# Patient Record
Sex: Female | Born: 1980 | Race: White | Hispanic: No | Marital: Married | Smoking: Never smoker
Health system: Southern US, Community
[De-identification: ages and names within clinical notes are randomized; demographics above are authoritative.]

## PROBLEM LIST (undated history)

## (undated) HISTORY — PX: CHOLECYSTECTOMY: SHX55

---
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
## 2001-04-23 ENCOUNTER — Other Ambulatory Visit: Admission: RE | Admit: 2001-04-23 | Discharge: 2001-04-23 | Payer: Self-pay | Admitting: *Deleted

## 2002-10-10 ENCOUNTER — Other Ambulatory Visit: Admission: RE | Admit: 2002-10-10 | Discharge: 2002-10-10 | Payer: Self-pay | Admitting: *Deleted

## 2010-08-15 ENCOUNTER — Encounter: Payer: Self-pay | Admitting: Family Medicine

## 2014-01-20 ENCOUNTER — Other Ambulatory Visit: Payer: Self-pay | Admitting: *Deleted

## 2014-01-20 ENCOUNTER — Telehealth: Payer: Self-pay | Admitting: *Deleted

## 2014-01-20 NOTE — Telephone Encounter (Signed)
Pt dropped off urine at [facility]. They need order for urinalysis and urine culture. Ok per Dr. Brett CanalesSteve. Order faxed.

## 2014-04-18 ENCOUNTER — Ambulatory Visit (INDEPENDENT_AMBULATORY_CARE_PROVIDER_SITE_OTHER): Payer: BC Managed Care – PPO | Admitting: Family Medicine

## 2014-04-18 ENCOUNTER — Encounter: Payer: Self-pay | Admitting: Family Medicine

## 2014-04-18 VITALS — BP 120/68 | Temp 98.9°F | Ht 67.0 in | Wt 170.0 lb

## 2014-04-18 DIAGNOSIS — M25559 Pain in unspecified hip: Secondary | ICD-10-CM

## 2014-04-18 DIAGNOSIS — M25569 Pain in unspecified knee: Principal | ICD-10-CM

## 2014-04-18 MED ORDER — DOXYCYCLINE HYCLATE 100 MG PO TABS: 100.0000 mg | ORAL_TABLET | Freq: Two times a day (BID) | ORAL | Status: DC

## 2014-04-18 NOTE — Progress Notes (Signed)
   Subjective:    Patient ID: Beverly Potts, female    DOB: April 26, 1981, 33 y.o.   MRN: [id number]  HPIHaving muscle and joint pain. Started several months ago. Getting worse. Has had lots of tick bites this summer.   achiness in the joints  Hand s feet and other joints   achey at times  Diminished energy  No sig   One expanded rash  achey no meds occas ibuprofen  No fever during these flares     Review of Systems No high fevers no headache no chills no change in bowel habits no chest pain ROS otherwise negative    Objective:   Physical Exam Alert no apparent distress. Vitals stable. H&T normal. Lungs clear. Heart rare rhythm. Legs no effusion in the joints no warmth no redness no acute tenderness.       Assessment & Plan:  Impression persistent arthralgias with history of many tick bites earlier in the summer. Discussed with patient it might be tick related to quite possibly not. Some family history of arthritis. Some family history of fibromyalgia. Plan trial of doxycycline for second stage Lyme disease. If no significant improvement Mora Appl return for further tests. WSL

## 2014-10-09 ENCOUNTER — Telehealth: Payer: Self-pay | Admitting: *Deleted

## 2014-10-09 NOTE — Telephone Encounter (Signed)
[facility] See [facility] report in nurse's basket.

## 2014-10-10 NOTE — Telephone Encounter (Signed)
[facility] to DR. Brett CanalesSteve. He wanted to know if they treated pt. Pt has been referred to a specialist. Pt states she does not need anything from our office at this time.

## 2014-12-23 ENCOUNTER — Other Ambulatory Visit: Payer: Self-pay

## 2014-12-23 MED ORDER — HALOBETASOL PROPIONATE 0.05 % EX OINT: TOPICAL_OINTMENT | Freq: Two times a day (BID) | CUTANEOUS | Status: DC

## 2015-10-02 ENCOUNTER — Emergency Department (HOSPITAL_COMMUNITY)
Admission: EM | Admit: 2015-10-02 | Discharge: 2015-10-02 | Disposition: A | Discharge status: Home / Self Care | Payer: Self-pay

## 2016-04-07 ENCOUNTER — Telehealth: Payer: Self-pay | Admitting: Family Medicine

## 2016-04-07 NOTE — Telephone Encounter (Signed)
#  1 certainly we are sympathetic #2 if her surgery was recent her surgeon truly all be the one to order this #3 if her surgery was a while ago or if the surgeon has essentially washed his hands that then patient will need an office visit tomorrow morning for further evaluation and set up for any tests including blood work and scans #4 it if patient feels she is having an emergent condition with severe pain or other worrisome signs she should go to the ER

## 2016-04-07 NOTE — Telephone Encounter (Signed)
Notified patient #1 certainly we are sympathetic #2 if her surgery was recent her surgeon truly all be the one to order this #3 if her surgery was a while ago or if the surgeon has essentially washed his hands that then patient will need an office visit tomorrow morning for further evaluation and set up for any tests including blood work and scans #4 it if patient feels she is having an emergent condition with severe pain or other worrisome signs she should go to the ER. Patient transferred to front desk to schedule appointment.

## 2016-04-07 NOTE — Telephone Encounter (Signed)
Patient had her gallbladder removed and she had gall stones which had to be removed by a separate procedure.  She started hurting again last night and believes she may need a CT scan to know if she needs to be set up to have the stones removed again.  She has called her surgeon and he said to do a CT scan, but told her to call her PCP to have this ordered.  She is hoping for a call back today.

## 2016-04-08 ENCOUNTER — Ambulatory Visit (INDEPENDENT_AMBULATORY_CARE_PROVIDER_SITE_OTHER): Payer: BLUE CROSS/BLUE SHIELD | Admitting: Family Medicine

## 2016-04-08 ENCOUNTER — Encounter: Payer: Self-pay | Admitting: Family Medicine

## 2016-04-08 ENCOUNTER — Other Ambulatory Visit (HOSPITAL_COMMUNITY)
Admission: RE | Admit: 2016-04-08 | Discharge: 2016-04-08 | Disposition: A | Discharge status: Home / Self Care | Payer: BLUE CROSS/BLUE SHIELD | Source: Ambulatory Visit | Attending: Family Medicine | Admitting: Family Medicine

## 2016-04-08 VITALS — BP 126/74 | Ht 67.0 in | Wt 174.5 lb

## 2016-04-08 DIAGNOSIS — R109 Unspecified abdominal pain: Secondary | ICD-10-CM | POA: Diagnosis not present

## 2016-04-08 LAB — HEPATIC FUNCTION PANEL
ALBUMIN: 3.9 g/dL (ref 3.5–5.0)
ALK PHOS: 76 U/L (ref 38–126)
ALT: 25 U/L (ref 14–54)
AST: 24 U/L (ref 15–41)
BILIRUBIN TOTAL: 0.3 mg/dL (ref 0.3–1.2)
Bilirubin, Direct: <0.1 mg/dL — ABNORMAL LOW (ref 0.1–0.5)
Total Protein: 8 g/dL (ref 6.5–8.1)

## 2016-04-08 LAB — CBC WITH DIFFERENTIAL/PLATELET
Basophils Absolute: 0 10*3/uL (ref 0.0–0.1)
Basophils Relative: 0 %
EOS PCT: 0 %
Eosinophils Absolute: 0 10*3/uL (ref 0.0–0.7)
HCT: 41.7 % (ref 36.0–46.0)
HEMOGLOBIN: 13.6 g/dL (ref 12.0–15.0)
LYMPHS ABS: 2.7 10*3/uL (ref 0.7–4.0)
LYMPHS PCT: 28 %
MCH: 28.2 pg (ref 26.0–34.0)
MCHC: 32.6 g/dL (ref 30.0–36.0)
MCV: 86.3 fL (ref 78.0–100.0)
Monocytes Absolute: 0.4 10*3/uL (ref 0.1–1.0)
Monocytes Relative: 4 %
Neutro Abs: 6.6 10*3/uL (ref 1.7–7.7)
Neutrophils Relative %: 68 %
PLATELETS: 314 10*3/uL (ref 150–400)
RBC: 4.83 MIL/uL (ref 3.87–5.11)
RDW: 12.4 % (ref 11.5–15.5)
WBC: 9.8 10*3/uL (ref 4.0–10.5)

## 2016-04-08 LAB — BASIC METABOLIC PANEL
Anion gap: 9 (ref 5–15)
BUN: 11 mg/dL (ref 6–20)
CHLORIDE: 102 mmol/L (ref 101–111)
CO2: 26 mmol/L (ref 22–32)
Calcium: 9.5 mg/dL (ref 8.9–10.3)
Creatinine, Ser: 0.67 mg/dL (ref 0.44–1.00)
GFR calc Af Amer: >60 mL/min (ref >60)
GFR calc non Af Amer: >60 mL/min (ref >60)
GLUCOSE: 113 mg/dL — AB (ref 65–99)
POTASSIUM: 3.6 mmol/L (ref 3.5–5.1)
Sodium: 137 mmol/L (ref 135–145)

## 2016-04-08 LAB — LIPASE, BLOOD: Lipase: 27 U/L (ref 11–51)

## 2016-04-08 LAB — AMYLASE: Amylase: 72 U/L (ref 28–100)

## 2016-04-08 MED ORDER — PREDNISONE 20 MG PO TABS: ORAL_TABLET | ORAL | 0 refills | Status: DC

## 2016-04-08 NOTE — Progress Notes (Signed)
   Subjective:    Patient ID: Beverly Potts, female    DOB: [birth year], 35 y.o.   MRN: [id number]  Abdominal Pain  This is a chronic problem. The current episode started in the past 7 days. The pain is located in the RUQ.   [facility]. Ha d baby three yrs ago, since then ongoing bgb issues  Had stones in the ducts  Got a ercp  Then next day cholecytectomy  thhis wk having abd pains and sever  Severe epigast/ruq pain, no nausea no vom  Pt touched base with g b they rec scan etc.  Aphthous ulcersIn the throat. Used to get him but now much more often since her ERCP intervention. Next  Patient is afraid she has another stone due to her discomfort. Blocking her bile duct.    Review of Systems  Gastrointestinal: Positive for abdominal pain.  No fever no chills no diarrhea no dysuria     Objective:   Physical Exam  Alert talkative no acute distress vital stable lungs clear heart rare rhythm m moderate right upper epigastric discomfort to palpation      Assessment & Plan:  Impression recurrence of abdominal pain. Patient feels colicky aspect similar to prior pain before cholecystectomy. Did have ERCP. Long discussion with patient. And I spoke with radiologist. They recommended ultrasound and is in good screening tests look at the common bile duct. We did urgent blood work. Please see results. Another long discussion held with the patient after that. Patient noted pain is now progressing and questions duodenitis ulcer etc. I told her this is definitely on the [place] if her ultrasound returns negative. We will go ahead and add an H. pylori. Patient to use Zantac for now. She did not want to use proton pump inhibitor until after workup is complete. Easily 35-40 minutes spent most in discussion between patient before and after testing along with discussion of radiologist elucidation a history discussion of potential etiologies etc.

## 2016-04-11 ENCOUNTER — Other Ambulatory Visit: Payer: Self-pay

## 2016-04-11 ENCOUNTER — Ambulatory Visit (HOSPITAL_COMMUNITY)
Admission: RE | Admit: 2016-04-11 | Discharge: 2016-04-11 | Disposition: A | Discharge status: Home / Self Care | Payer: BLUE CROSS/BLUE SHIELD | Source: Ambulatory Visit | Attending: Family Medicine | Admitting: Family Medicine

## 2016-04-11 ENCOUNTER — Telehealth: Payer: Self-pay | Admitting: Family Medicine

## 2016-04-11 DIAGNOSIS — R109 Unspecified abdominal pain: Secondary | ICD-10-CM

## 2016-04-11 DIAGNOSIS — Z9049 Acquired absence of other specified parts of digestive tract: Secondary | ICD-10-CM | POA: Insufficient documentation

## 2016-04-11 MED ORDER — PANTOPRAZOLE SODIUM 40 MG PO TBEC: 40.0000 mg | DELAYED_RELEASE_TABLET | Freq: Every day | ORAL | 5 refills | Status: DC

## 2016-04-11 NOTE — Addendum Note (Signed)
Addended by: Jeralene PetersREWS, Cartier Mapel R on: 04/11/2016 03:49 PM   Modules accepted: Orders

## 2016-04-11 NOTE — Telephone Encounter (Signed)
Patient called and asked if we could call in a prescription probiotic that she can take this weekend to calm her stomach down while at the beach. Please advise?

## 2016-04-11 NOTE — Progress Notes (Unsigned)
Ordered H.Pylori blood work per Charles SchwabDr.Steve Luking- Patient notified and stated that she had H.Pylori drawn yesterday at [facility] [place] yesterday due to pain. Patient also stated that she had ultrasound done today.

## 2016-04-14 ENCOUNTER — Encounter: Payer: Self-pay | Admitting: Gastroenterology

## 2016-04-14 NOTE — Telephone Encounter (Signed)
Spoke with patient and informed her per Dr.Steve Luking-Dr.Steve has not prescribed probiotics before. Dr.Steve recommends align or you can call pharmacist and see what they recommend. Patient verbalized understanding.

## 2016-04-14 NOTE — Telephone Encounter (Signed)
Ive never rx'ed I think otc variations are fine I like align or ask her pharm which they recommedn

## 2016-05-18 ENCOUNTER — Encounter: Payer: Self-pay | Admitting: Gastroenterology

## 2016-05-18 ENCOUNTER — Ambulatory Visit (INDEPENDENT_AMBULATORY_CARE_PROVIDER_SITE_OTHER): Payer: BLUE CROSS/BLUE SHIELD | Admitting: Gastroenterology

## 2016-05-18 DIAGNOSIS — R194 Change in bowel habit: Secondary | ICD-10-CM | POA: Insufficient documentation

## 2016-05-18 MED ORDER — DICYCLOMINE HCL 10 MG PO CAPS
10.0000 mg | ORAL_CAPSULE | Freq: Three times a day (TID) | ORAL | 3 refills | Status: AC
Start: 2016-05-18 — End: ?

## 2016-05-18 NOTE — Progress Notes (Signed)
       Primary Care Physician:  Veto KempsKelli Banner, NP  Primary Gastroenterologist:  Dr. Darrick PennaFields   Chief Complaint  Patient presents with  . Abdominal Pain    pain better but still loose BM; stool cultures, c.diff, o&p neg. Xray and CT negative    HPI:   Beverly Potts is a 35 y.o. female presenting today at the request of Veto KempsKelli Banner, NP, secondary to diarrhea.   [facility] Had ERCP due to CBD stones, cholecystectomy. September 2017 had severe pain in epigastric region, profuse watery stool. Was having 20-30 loose stools per day. Treated empirically with Flagyl and got better. Now having 5-6 soft/loose stools per day. No abdominal pain now. No weight loss.   Baseline bowel habits 3-4 times per week. No rectal bleeding. Reportedly had negative stool studies, but I do not have these available.   No past medical history on file.  Past Surgical History:  Procedure Laterality Date  . CHOLECYSTECTOMY     with ERCP     Current Outpatient Prescriptions  Medication Sig Dispense Refill  . levonorgestrel-ethinyl estradiol (SEASONALE,INTROVALE,JOLESSA) 0.15-0.03 MG tablet Take 1 tablet by mouth daily.  4  . dicyclomine (BENTYL) 10 MG capsule Take 1 capsule (10 mg total) by mouth 4 (four) times daily -  before meals and at bedtime. 120 capsule 3   No current facility-administered medications for this visit.     Allergies as of 05/18/2016  . (No Known Allergies)    Family History  Problem Relation Age of Onset  . Colon cancer Maternal Aunt   . Colon polyps Neg Hx     Social History   Social History  . Marital status: Married    Spouse name: N/A  . Number of children: N/A  . Years of education: N/A   Occupational History  . Not on file.   Social History Main Topics  . Smoking status: Never Smoker  . Smokeless tobacco: Never Used  . Alcohol use No  . Drug use: No  . Sexual activity: Not on file   Other Topics Concern  . Not on file   Social History  Narrative  . No narrative on file    Review of Systems: As mentioned in HPI   Physical Exam: BP 125/86   Pulse 95   Temp 98.2 F (36.8 C) (Oral)   Ht 5\' 9"  (1.753 m)   Wt 170 lb 6.4 oz (77.3 kg)   LMP 08/10/2015 (Approximate)   BMI 25.16 kg/m  General:   Alert and oriented. Pleasant and cooperative. Well-nourished and well-developed.  Head:  Normocephalic and atraumatic. Eyes:  Without icterus, sclera clear and conjunctiva pink.  Ears:  Normal auditory acuity. Lungs:  Clear to auscultation bilaterally. No wheezes, rales, or rhonchi. No distress.  Heart:  S1, S2 present without murmurs appreciated.  Abdomen:  +BS, soft, non-tender and non-distended. No HSM noted. No guarding or rebound. No masses appreciated.  Rectal:  Deferred  Msk:  Symmetrical without gross deformities. Normal posture. Extremities:  Without clubbing or edema. Neurologic:  Alert and  oriented x4;  grossly normal neurologically. Psych:  Alert and cooperative. Normal mood and affect.

## 2016-05-18 NOTE — Patient Instructions (Signed)
Start taking Bentyl two to 4 times a day with meals and at bedtime (no more than 4 a day). Monitor for constipation.   Start taking a probiotic daily.   We will see you in 6-8 weeks. Call if any recurrent symptoms.

## 2016-05-20 ENCOUNTER — Encounter: Payer: Self-pay | Admitting: Gastroenterology

## 2016-05-20 NOTE — Assessment & Plan Note (Signed)
35 year old female with self-limiting episode of severe diarrhea that is now much improved, reportedly negative stool studies, and symptomatically improving. Now with 5-6 soft/loose stools per day but without any overt GI bleeding or concerning lower GI signs. Query dealing with post-infectious IBS. Will attempt to retrieve stool reports, start bentyl for supportive measures, add probiotic, and return in 6-8 weeks. No colonoscopy indicated unless failure to improve, rectal bleeding noted, or worsening diarrhea.

## 2016-05-23 NOTE — Progress Notes (Signed)
CC'D TO PCP °

## 2016-07-11 ENCOUNTER — Encounter: Payer: Self-pay | Admitting: Gastroenterology

## 2016-07-11 ENCOUNTER — Ambulatory Visit (INDEPENDENT_AMBULATORY_CARE_PROVIDER_SITE_OTHER): Payer: BLUE CROSS/BLUE SHIELD | Admitting: Gastroenterology

## 2016-07-11 VITALS — BP 121/81 | HR 106 | Temp 98.3°F | Ht 68.0 in | Wt 169.8 lb

## 2016-07-11 DIAGNOSIS — R194 Change in bowel habit: Secondary | ICD-10-CM

## 2016-07-11 DIAGNOSIS — R197 Diarrhea, unspecified: Secondary | ICD-10-CM | POA: Diagnosis not present

## 2016-07-11 NOTE — Patient Instructions (Signed)
Please have blood work done today.   Further recommendations to follow after review of blood work.

## 2016-07-11 NOTE — Progress Notes (Signed)
cc'ed to pcp °

## 2016-07-11 NOTE — Progress Notes (Signed)
    Referring Provider: Merlyn AlbertLuking, William S, MD Primary Care Physician:  Lubertha SouthSteve Luking, MD  Primary GI: Dr. Darrick PennaFields   Chief Complaint  Patient presents with  . Abdominal Pain    f/u, better, no problems as long as she doesn't eat fast food or drinks    HPI:   Beverly Potts is a 35 y.o. female presenting today with a history of self-limiting episode of severe diarrhea, stool studies including Cdiff negative. 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 Gallbladder absent, see prior HPI from 05/18/16.   Can eat burgers at home but not at fast food places. All fast food places tear her stomach up. Otherwise, no issues with BMs. Bentyl didn't help. No rectal bleeding. No issues with weight loss. Good appetite.    No past medical history on file.  Past Surgical History:  Procedure Laterality Date  . CHOLECYSTECTOMY     with ERCP     Current Outpatient Prescriptions  Medication Sig Dispense Refill  . levonorgestrel-ethinyl estradiol (SEASONALE,INTROVALE,JOLESSA) 0.15-0.03 MG tablet Take 1 tablet by mouth daily.  4  . dicyclomine (BENTYL) 10 MG capsule Take 1 capsule (10 mg total) by mouth 4 (four) times daily -  before meals and at bedtime. (Patient not taking: Reported on 07/11/2016) 120 capsule 3   No current facility-administered medications for this visit.     Allergies as of 07/11/2016  . (No Known Allergies)    Family History  Problem Relation Age of Onset  . Colon cancer Maternal Aunt   . Colon polyps Neg Hx     Social History   Social History  . Marital status: Married    Spouse name: N/A  . Number of children: N/A  . Years of education: N/A   Social History Main Topics  . Smoking status: Never Smoker  . Smokeless tobacco: Never Used  . Alcohol use No  . Drug use: No  . Sexual activity: Not Asked   Other Topics Concern  . None   Social History Narrative  . None    Review of Systems: As mentioned in HPI    Physical Exam: BP 121/81   Pulse (!) 106   Temp 98.3 F (36.8 C) (Oral)   Ht 5\' 8"  (1.727 m)   Wt 169 lb 12.8 oz (77 kg)   BMI 25.82 kg/m  General:   Alert and oriented. No distress noted. Pleasant and cooperative.  Head:  Normocephalic and atraumatic. Eyes:  Conjuctiva clear without scleral icterus. Mouth:  Oral mucosa pink and moist. Good dentition. No lesions. Abdomen:  +BS, soft, non-tender and non-distended. No rebound or guarding. No HSM or masses noted. Msk:  Symmetrical without gross deformities. Normal posture. Extremities:  Without edema. Neurologic:  Alert and  oriented x4 Psych:  Alert and cooperative. Normal mood and affect.

## 2016-07-11 NOTE — Assessment & Plan Note (Signed)
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 Symptoms much improved since last seen, only occurring with fast food intake and sodas. Bentyl without much improvement and actually bordered more on constipation. Will check celiac serologies to be thorough; no red flags noted. Further recommendations to follow.

## 2016-07-12 LAB — IGA: IGA: 300 mg/dL (ref 81–463)

## 2016-07-12 LAB — TISSUE TRANSGLUTAMINASE, IGA: TISSUE TRANSGLUTAMINASE AB, IGA: 1 U/mL (ref <4)

## 2016-07-16 NOTE — Progress Notes (Signed)
REVIEWED-NO ADDITIONAL RECOMMENDATIONS. 

## 2016-07-21 ENCOUNTER — Encounter: Payer: Self-pay | Admitting: Gastroenterology

## 2018-05-28 IMAGING — US US ABDOMEN LIMITED
1 series · 14 of 25 positions shown · non-contrast
Comparison: None.

CLINICAL DATA: Right upper quadrant pain .

EXAM:
US ABDOMEN LIMITED - RIGHT UPPER QUADRANT

[Series 1: us abdomen limited · 0.22mm/px · 14 of 46 slices shown]
[im 1/46]
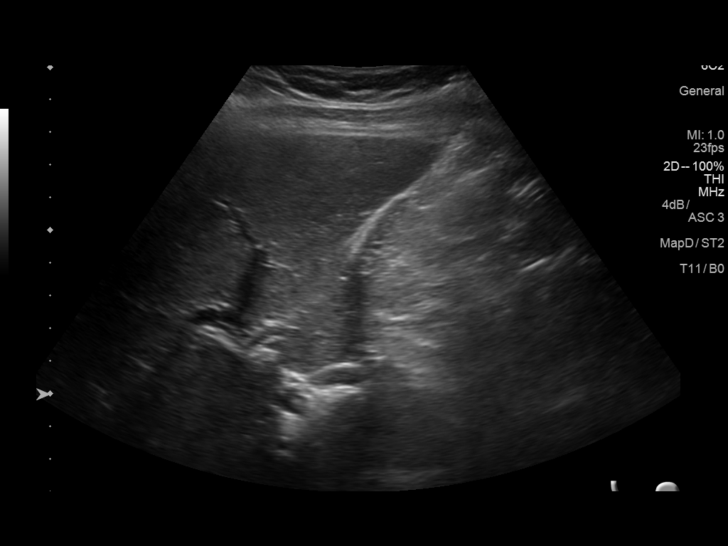
[im 4/46]
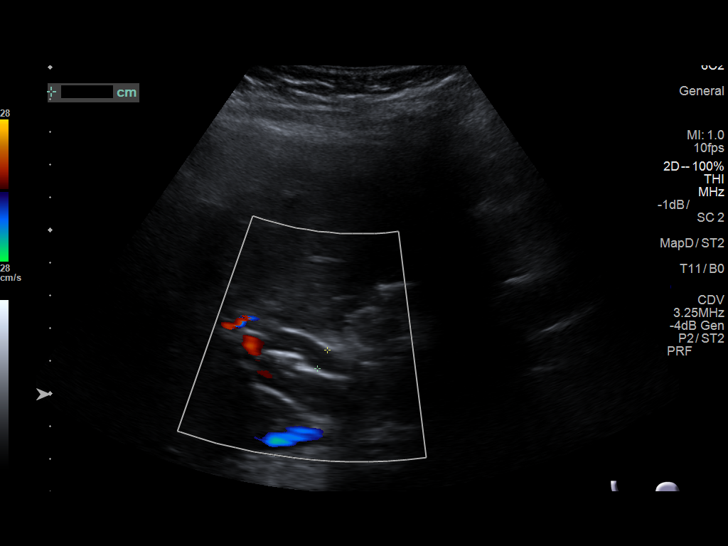
[im 8/46]
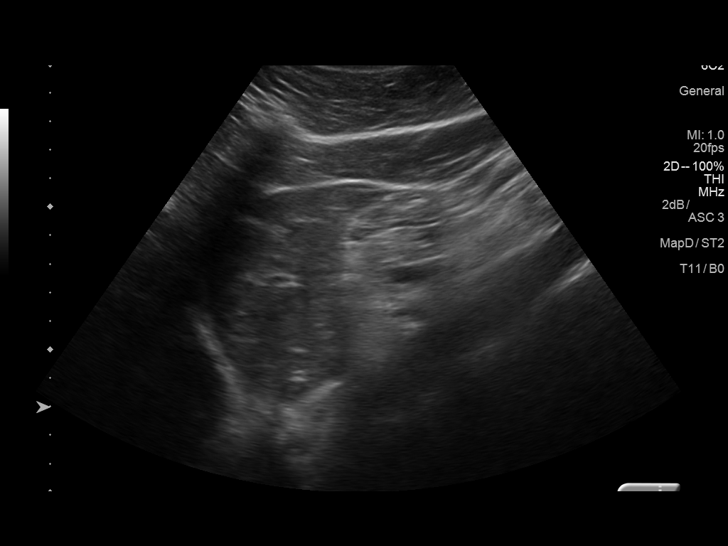
[im 12/46]
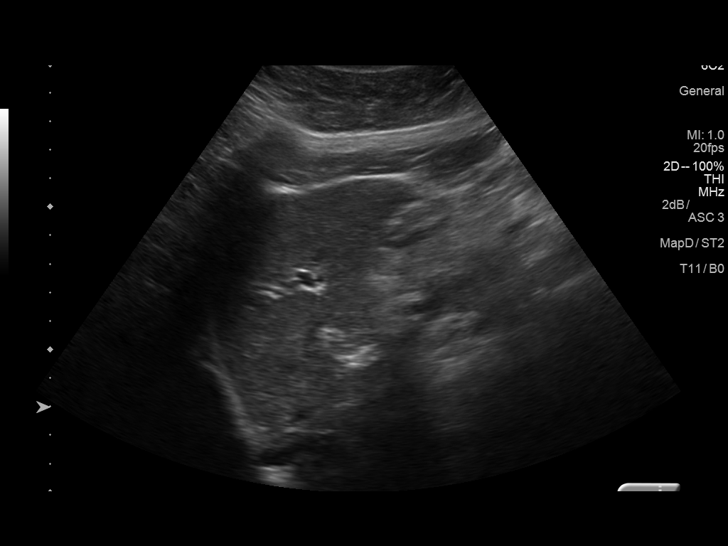
[im 16/46]
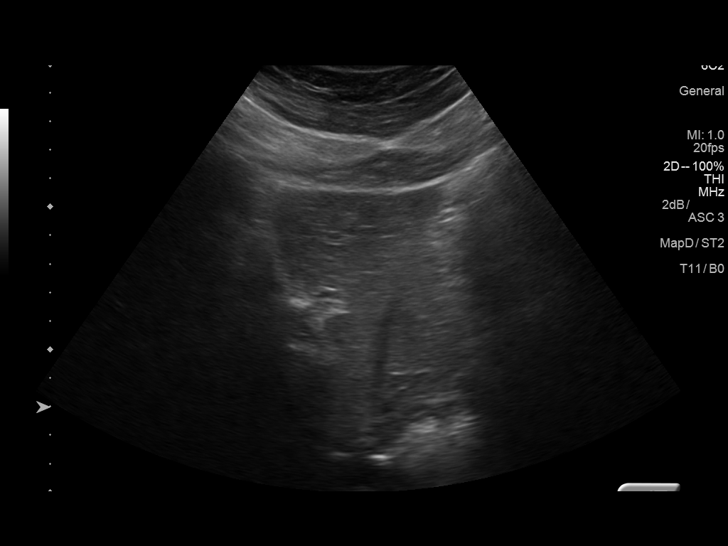
[im 17/46]
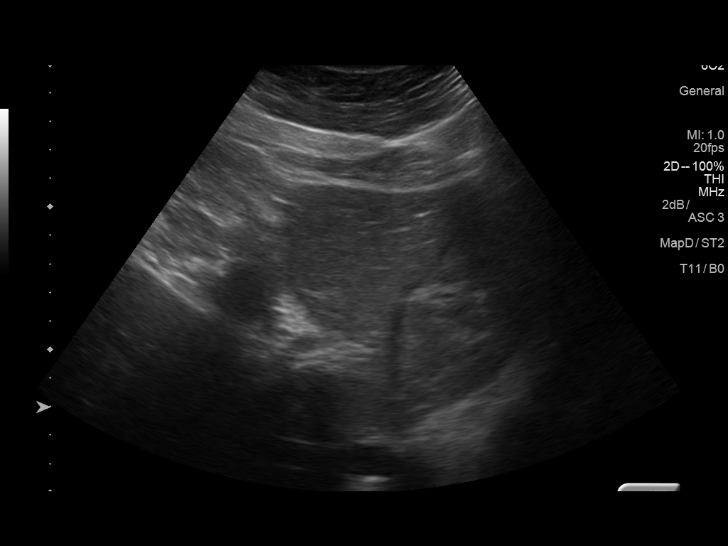
[im 21/46]
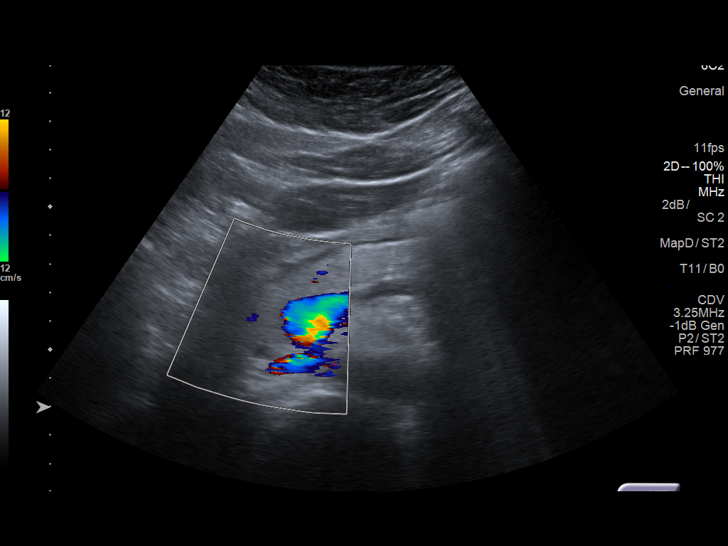
[im 25/46]
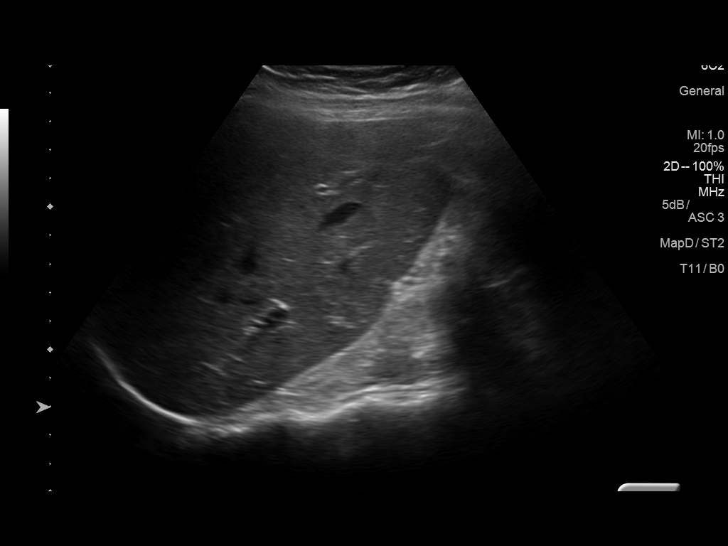
[im 29/46]
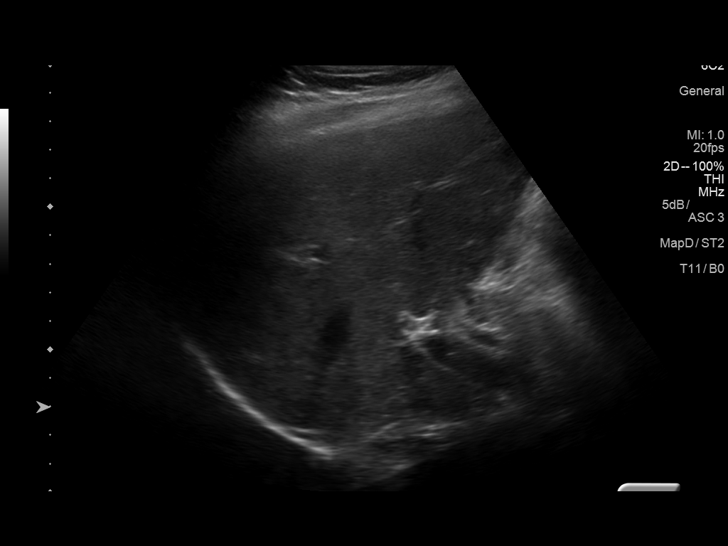
[im 31/46]
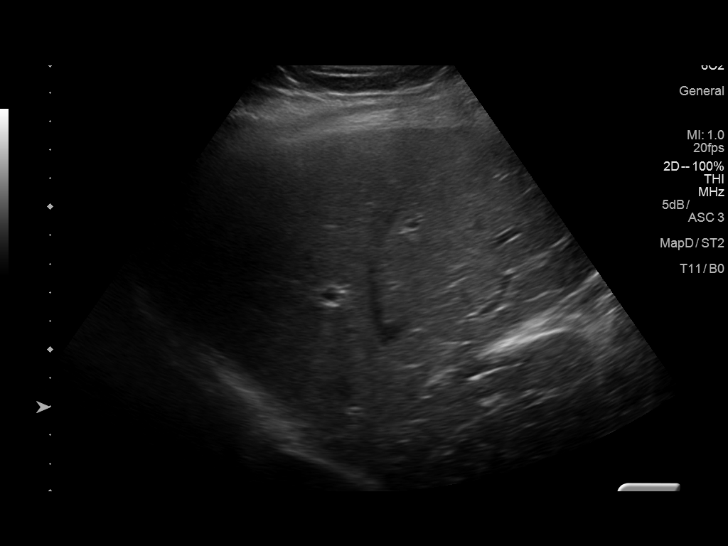
[im 34/46]
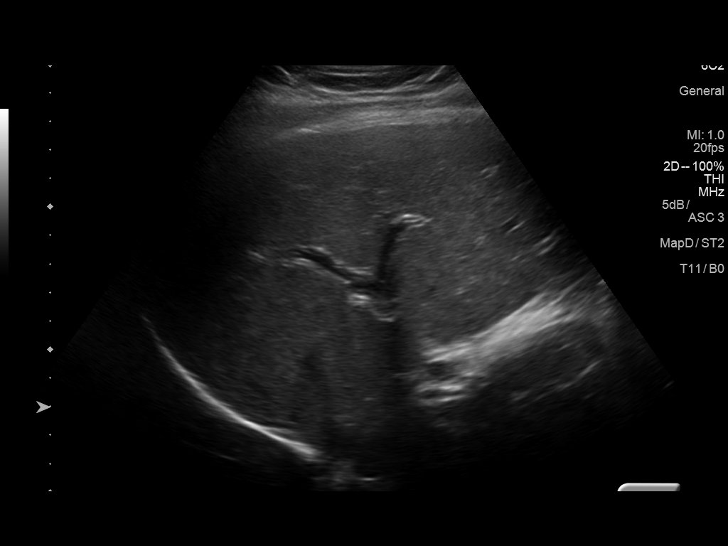
[im 38/46]
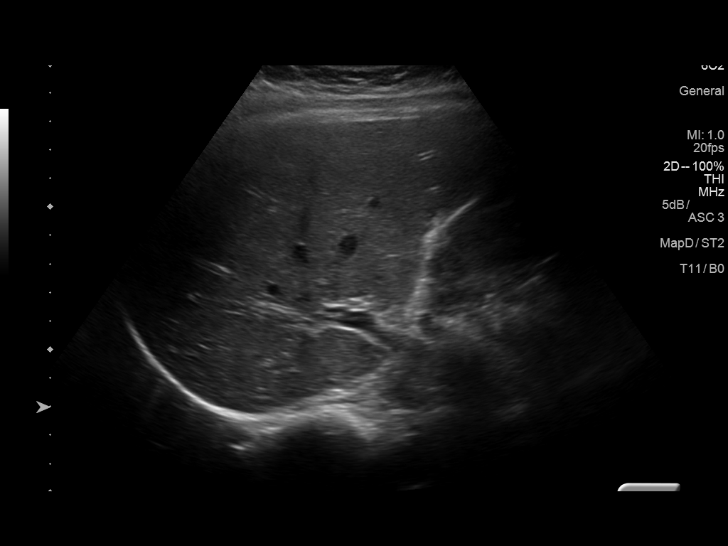
[im 42/46]
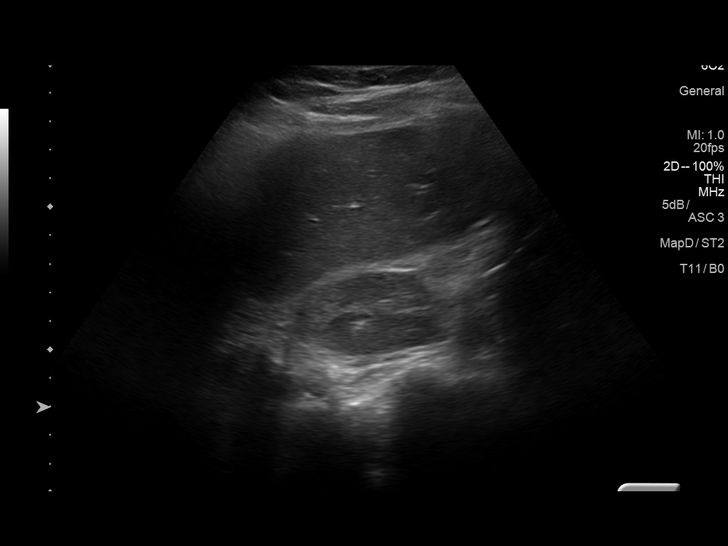
[im 46/46]
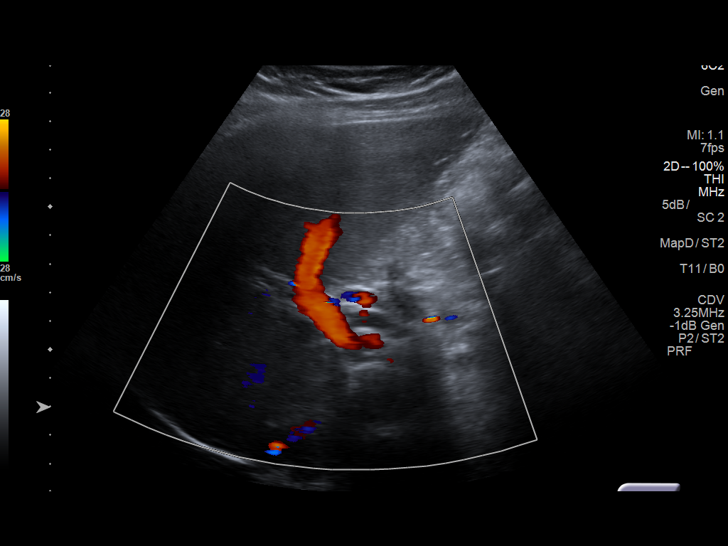

[14 of 25 positions shown; findings below may reference images not displayed]

FINDINGS: Gallbladder:

Cholecystectomy.

Common bile duct:

Diameter: 6.4 mm

Liver:

No focal lesion identified. Within normal limits in parenchymal
echogenicity. Questionable mild intrahepatic biliary ductal
dilatation .
IMPRESSION: 1.  Cholecystectomy.

2. Questionable mild intrahepatic biliary ductal dilatation. The
common bile duct is normal in caliber. No focal obstructing lesion
is identified. Correlate with liver function tests. If need be
gadolinium-enhanced abdominal MRI with MRCP can be obtained.

## 2019-08-26 ENCOUNTER — Encounter: Payer: Self-pay | Admitting: Family Medicine

## 2021-09-22 ENCOUNTER — Ambulatory Visit: Payer: BLUE CROSS/BLUE SHIELD | Admitting: Dermatology
